# Patient Record
Sex: Male | Born: 1975 | Hispanic: No | Marital: Single
Health system: Southern US, Community
[De-identification: ages and names within clinical notes are randomized; demographics above are authoritative.]

## PROBLEM LIST (undated history)

## (undated) DIAGNOSIS — I10 Essential (primary) hypertension: Secondary | ICD-10-CM

## (undated) DIAGNOSIS — E079 Disorder of thyroid, unspecified: Secondary | ICD-10-CM

## (undated) HISTORY — PX: NECK SURGERY: SHX720

---

## 2018-06-04 ENCOUNTER — Encounter (HOSPITAL_BASED_OUTPATIENT_CLINIC_OR_DEPARTMENT_OTHER): Payer: Self-pay | Admitting: Emergency Medicine

## 2018-06-04 ENCOUNTER — Other Ambulatory Visit: Payer: Self-pay

## 2018-06-04 ENCOUNTER — Emergency Department (HOSPITAL_BASED_OUTPATIENT_CLINIC_OR_DEPARTMENT_OTHER): Payer: Self-pay

## 2018-06-04 ENCOUNTER — Emergency Department (HOSPITAL_BASED_OUTPATIENT_CLINIC_OR_DEPARTMENT_OTHER)
Admission: EM | Admit: 2018-06-04 | Discharge: 2018-06-04 | Disposition: A | Discharge status: Home / Self Care | Payer: Self-pay | Attending: Emergency Medicine | Admitting: Emergency Medicine

## 2018-06-04 DIAGNOSIS — Z79899 Other long term (current) drug therapy: Secondary | ICD-10-CM | POA: Insufficient documentation

## 2018-06-04 DIAGNOSIS — Y9302 Activity, running: Secondary | ICD-10-CM | POA: Insufficient documentation

## 2018-06-04 DIAGNOSIS — Y929 Unspecified place or not applicable: Secondary | ICD-10-CM | POA: Insufficient documentation

## 2018-06-04 DIAGNOSIS — Y998 Other external cause status: Secondary | ICD-10-CM | POA: Insufficient documentation

## 2018-06-04 DIAGNOSIS — Y33XXXA Other specified events, undetermined intent, initial encounter: Secondary | ICD-10-CM | POA: Insufficient documentation

## 2018-06-04 DIAGNOSIS — S93401A Sprain of unspecified ligament of right ankle, initial encounter: Secondary | ICD-10-CM | POA: Insufficient documentation

## 2018-06-04 DIAGNOSIS — I10 Essential (primary) hypertension: Secondary | ICD-10-CM | POA: Insufficient documentation

## 2018-06-04 HISTORY — DX: Essential (primary) hypertension: I10

## 2018-06-04 HISTORY — DX: Disorder of thyroid, unspecified: E07.9

## 2018-06-04 MED ORDER — HYDROCODONE-ACETAMINOPHEN 5-325 MG PO TABS
2.0000 | ORAL_TABLET | Freq: Once | ORAL | Status: AC
  Administered 2018-06-04: 2 via ORAL
  Filled 2018-06-04: qty 2

## 2018-06-04 MED ORDER — HYDROCODONE-ACETAMINOPHEN 5-325 MG PO TABS
1.0000 | ORAL_TABLET | Freq: Four times a day (QID) | ORAL | 0 refills | Status: AC | PRN
Start: 2018-06-04 — End: 2018-06-07

## 2018-06-04 MED ORDER — ALBUTEROL SULFATE (2.5 MG/3ML) 0.083% IN NEBU: 5.0000 mg | INHALATION_SOLUTION | Freq: Once | RESPIRATORY_TRACT | Status: DC

## 2018-06-04 MED ORDER — HYDROCODONE-ACETAMINOPHEN 10-325 MG PO TABS
1.0000 | ORAL_TABLET | Freq: Once | ORAL | Status: DC
  Filled 2018-06-04: qty 1

## 2018-06-04 NOTE — ED Triage Notes (Signed)
R foot pain and swelling since yesterday after twisting it. Swelling noted.

## 2018-06-04 NOTE — Discharge Instructions (Addendum)
You were seen today for right foot and ankle pain.  There is nothing broken on your x-rays which is good news.  You may have sprained or even torn a ligament in your foot though.  You will need to follow-up with orthopedic specialist for further management.  You can call [facility] as listed above.  Please take Tylenol or ibuprofen as needed for pain.  Continue icing the foot and ankle as you have been, you will need to do this at least 4 times a day for 10 minutes at a time.  Continue propping up your foot as you have been. Take care!

## 2018-06-04 NOTE — ED Notes (Signed)
Patient transported to X-ray 

## 2018-06-04 NOTE — ED Provider Notes (Signed)
[facility] EMERGENCY DEPARTMENT Provider Note   CSN: [id number] Arrival date & time: 06/04/18  1044     History   Chief Complaint Chief Complaint  Patient presents with  . Foot Injury    HPI James Hodges is a 42 y.o. male here for RIGHT foot injury  Patient notes that he was running yesterday in the grass, he was about 4 miles and when his ankle hyper inverted and foot supinated. Immediate symptoms: immediate pain, delayed swelling, was able to bear weight directly after injury, no deformity was noted by the patient. Symptoms have been worsening since that time. Prior history of related problems: previous surgery of the lateral ankle. There is pain and swelling at the lateral aspect of that ankle extends to the top part of the foot.  He has tried Tylenol and ibuprofen at home without relief of his pain.  He is unable to walk on his foot without severe pain.  He also endorses some numbness over the top part of his foot and difficulty flexing and extending his foot  HPI  Past Medical History:  Diagnosis Date  . Hypertension   . Thyroid disease     There are no active problems to display for this patient.   Past Surgical History:  Procedure Laterality Date  . NECK SURGERY          Home Medications    Prior to Admission medications   Medication Sig Start Date End Date Taking? Authorizing Provider  amLODipine (NORVASC) 10 MG tablet Take 10 mg by mouth daily.   Yes [provider]  losartan (COZAAR) 100 MG tablet Take 100 mg by mouth daily.   Yes [provider]    Family History No family history on file.  Social History Social History   Tobacco Use  . Smoking status: Not on file  Substance Use Topics  . Alcohol use: Not on file  . Drug use: Not on file     Allergies   Patient has no known allergies.   Review of Systems Review of Systems  Constitutional: Negative for activity change and fever.  HENT: Negative for  congestion and ear discharge.   Musculoskeletal: Positive for joint swelling.     Physical Exam Updated Vital Signs BP 134/78 (BP Location: Right Arm)   Pulse 89   Temp 97.9 F (36.6 C) (Oral)   Resp 16   Ht 5\' 10"  (1.778 m)   Wt 98.4 kg (217 lb)   SpO2 99%   BMI 31.14 kg/m   Physical Exam  Constitutional: He appears well-developed and well-nourished.  HENT:  Head: Normocephalic.  Eyes: Pupils are equal, round, and reactive to light.  Neck: Normal range of motion.  Cardiovascular: Normal rate, regular rhythm, normal heart sounds and intact distal pulses.  Pulmonary/Chest: Effort normal and breath sounds normal.  Abdominal: Soft.  Musculoskeletal:       Right ankle: He exhibits swelling (Swelling in the lateral ankle and dorsal part of the midfoot ) and ecchymosis (Lateral ankle and midfoot). He exhibits normal range of motion (Difficulty with flexion and extension secondary to pain), no deformity and normal pulse. Tenderness. Lateral malleolus and AITFL tenderness found. No medial malleolus, no head of 5th metatarsal and no proximal fibula tenderness found. Achilles tendon exhibits no pain.       Left ankle: Normal. He exhibits normal range of motion, no swelling, no ecchymosis, no deformity and normal pulse.  Unable to completely flex and extend right foot  secondary to pain. Unable to invert or evert foot     ED Treatments / Results  Labs (all labs ordered are listed, but only abnormal results are displayed) Labs Reviewed - No data to display  EKG None  Radiology Dg Ankle Complete Right  Result Date: 06/04/2018 CLINICAL DATA:  Right ankle twisting injury yesterday with onset of pain. Initial encounter. EXAM: RIGHT ANKLE - COMPLETE 3+ VIEW COMPARISON:  None. FINDINGS: No acute bony or joint abnormality is identified. Os trigonum is noted. Mild osteophytosis off the anterior lip of the tibia is seen. No tibiotalar joint effusion. IMPRESSION: No acute abnormality.  Electronically Signed   By: Drusilla Kanner M.D.   On: 06/04/2018 11:53   Dg Foot Complete Right  Result Date: 06/04/2018 CLINICAL DATA:  Right foot swelling and pain. EXAM: RIGHT FOOT COMPLETE - 3+ VIEW COMPARISON:  No recent. FINDINGS: No acute bony or joint abnormality identified. No evidence of fracture or dislocation. Diffuse mild degenerative change. IMPRESSION: No acute abnormality.  Diffuse mild degenerative change. Electronically Signed   By: Maisie Fus  Register   On: 06/04/2018 11:14    Procedures Procedures (including critical care time)  Medications Ordered in ED Medications  HYDROcodone-acetaminophen (NORCO/VICODIN) 5-325 MG per tablet 2 tablet (2 tablets Oral Given 06/04/18 1152)     Initial Impression / Assessment and Plan / ED Course  I have reviewed the triage vital signs and the nursing notes.  Pertinent labs & imaging results that were available during my care of the patient were reviewed by me and considered in my medical decision making (see chart for details).    42 year old male here for right ankle/foot injury in the setting of hyper inversion and supination while running.  Ankle and foot x-rays do not show any signs of acute fracture.  He likely has an ankle sprain.  Patient placed in cam boot and given 1 dose of Norco for pain.  He was also given crutches and instructed to follow-up with orthopedic specialist.  He will take Tylenol or ibuprofen as needed for pain at home.  Discussed "RICE" therapy. Patient stable for discharge at this time.   Final Clinical Impressions(s) / ED Diagnoses   Final diagnoses:  Sprain of right ankle, unspecified ligament, initial encounter    ED Discharge Orders    None       Beaulah Dinning, MD 06/04/18 1247    Pricilla Loveless, MD 06/04/18 [id number]

## 2020-05-19 IMAGING — CR DG FOOT COMPLETE 3+V*R*
3 series · 3 of 3 positions shown · non-contrast
Comparison: No recent.

CLINICAL DATA: Right foot swelling and pain.

EXAM:
RIGHT FOOT COMPLETE - 3+ VIEW

[t foot ap right]
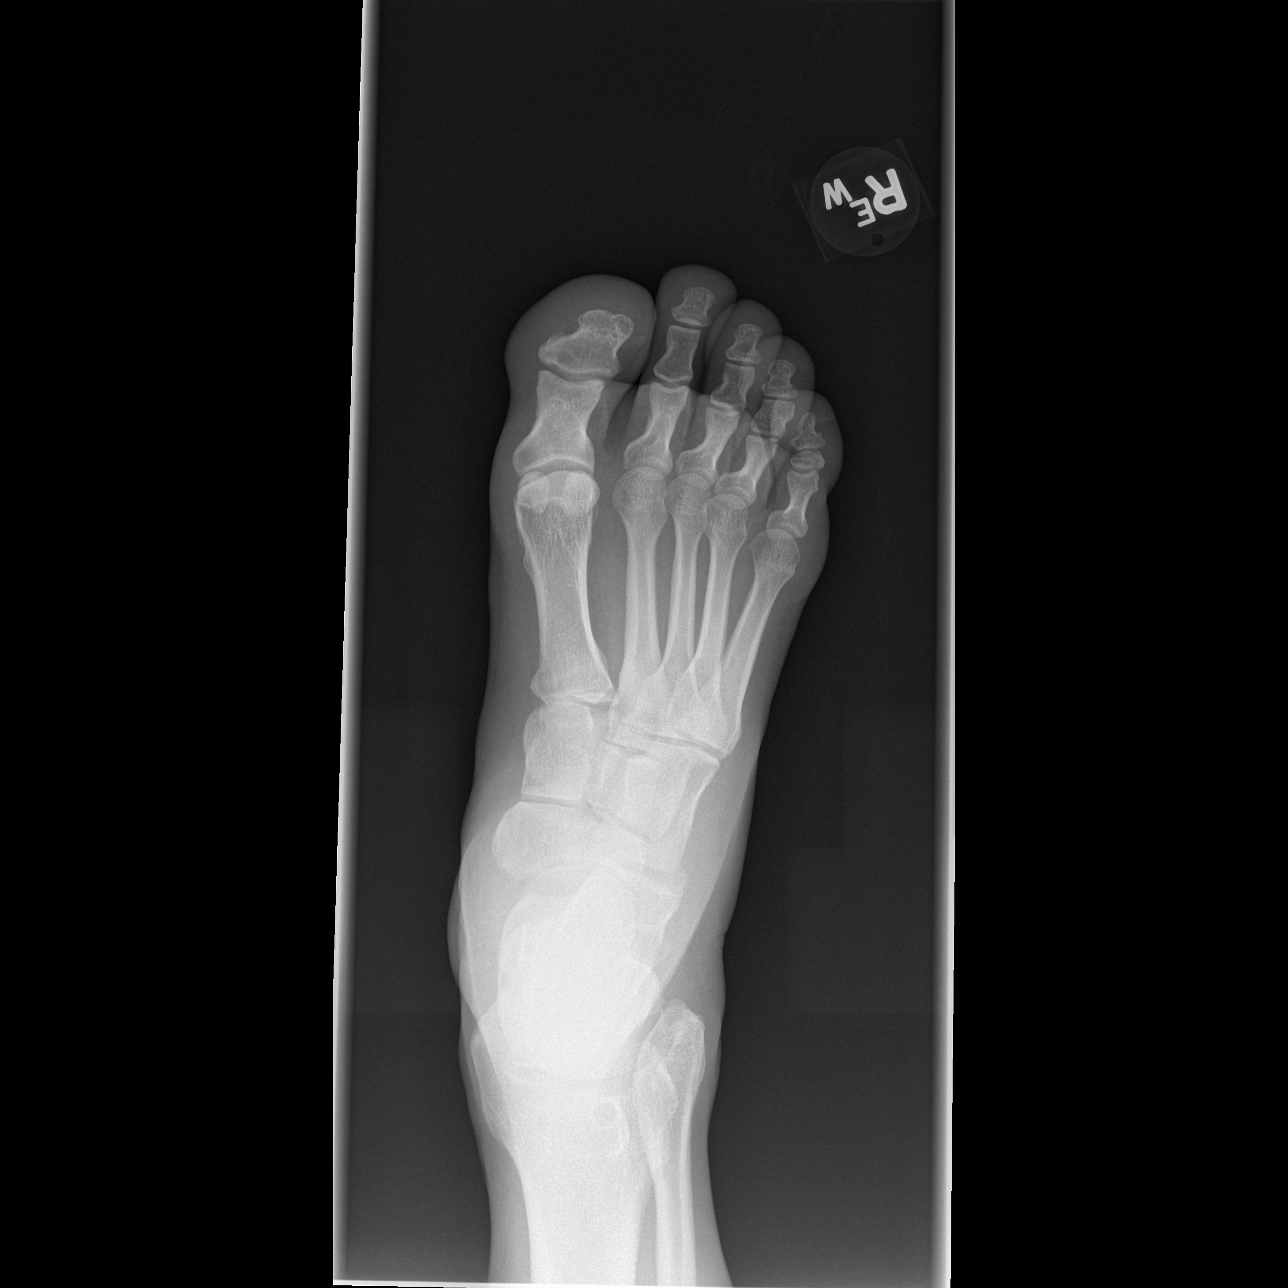

[t foot oblique right]
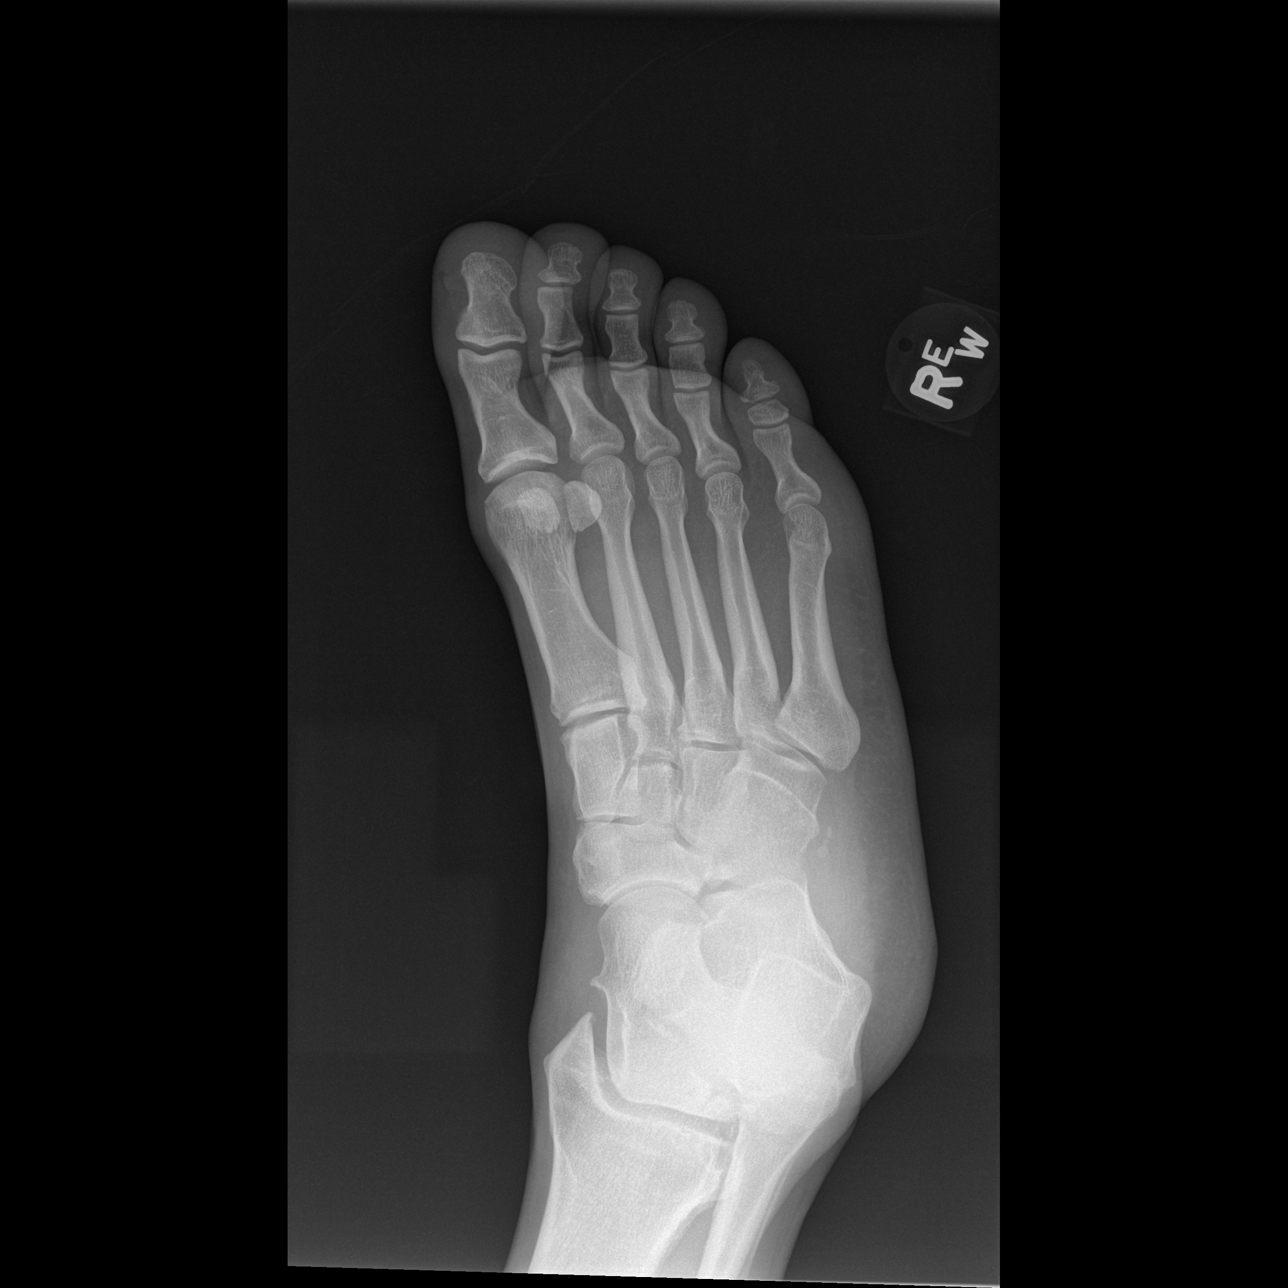

[t foot lat right]
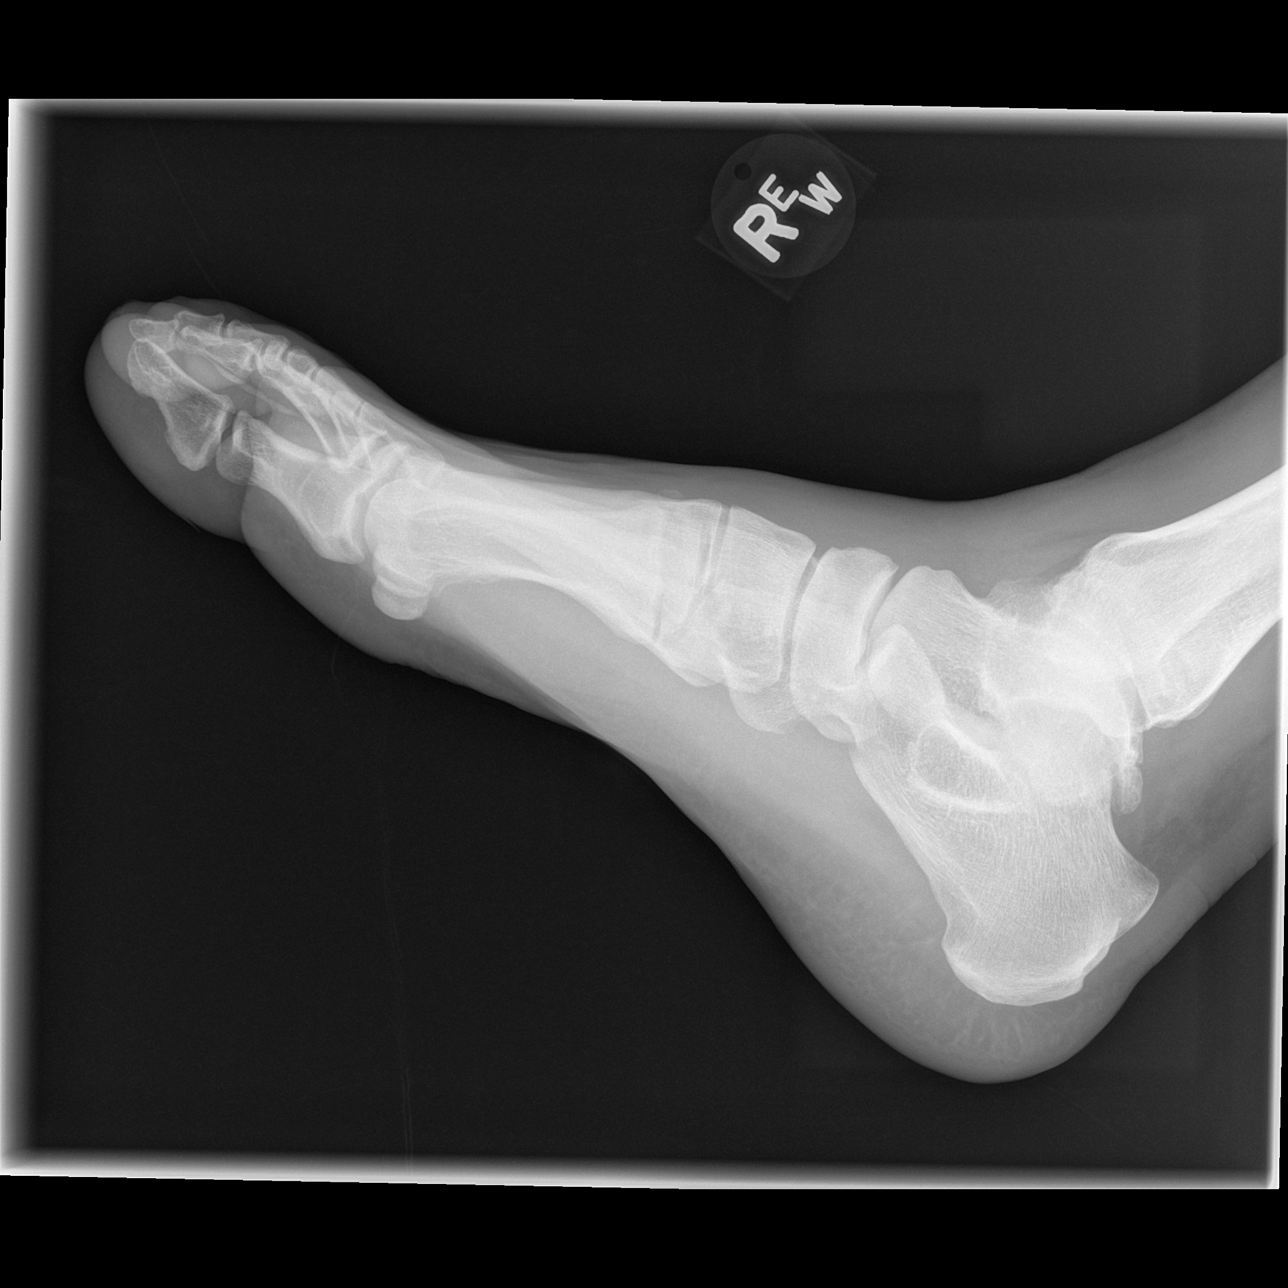

[3 of 3 positions shown; findings below may reference images not displayed]

FINDINGS: No acute bony or joint abnormality identified. No evidence of
fracture or dislocation. Diffuse mild degenerative change.
IMPRESSION: No acute abnormality.  Diffuse mild degenerative change.

## 2020-05-19 IMAGING — CR DG ANKLE COMPLETE 3+V*R*
3 series · 3 of 3 positions shown · non-contrast
Comparison: None.

CLINICAL DATA: Right ankle twisting injury yesterday with onset of
pain. Initial encounter.

EXAM:
RIGHT ANKLE - COMPLETE 3+ VIEW

[t ankle joint ap right]
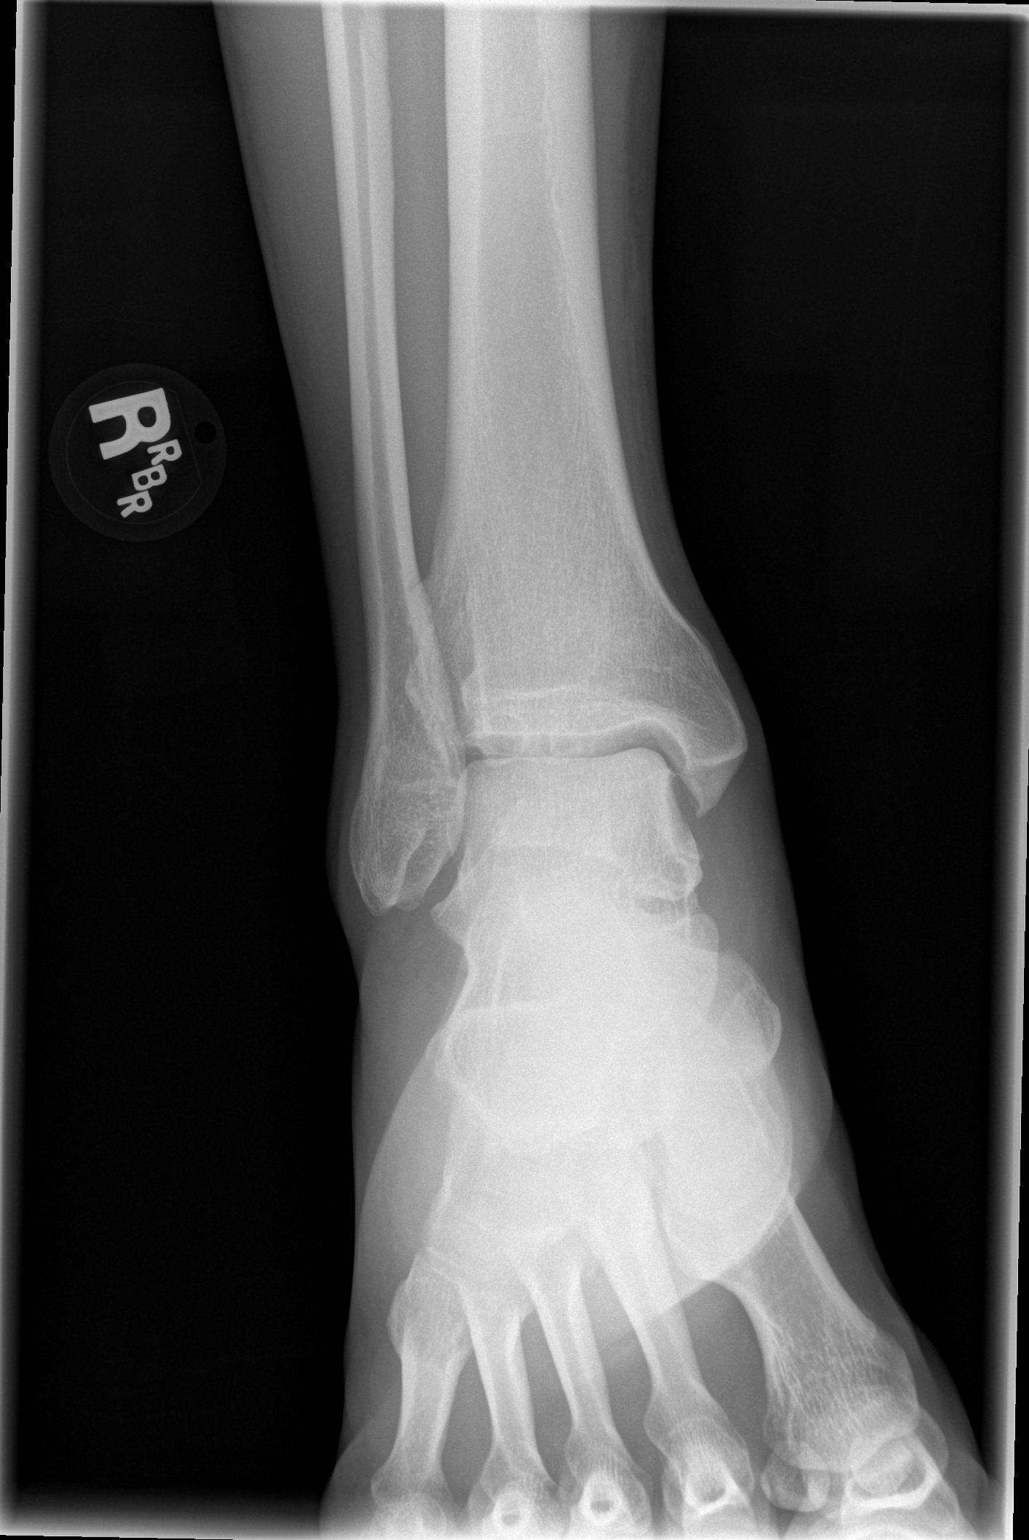

[t ankle joint oblique right]
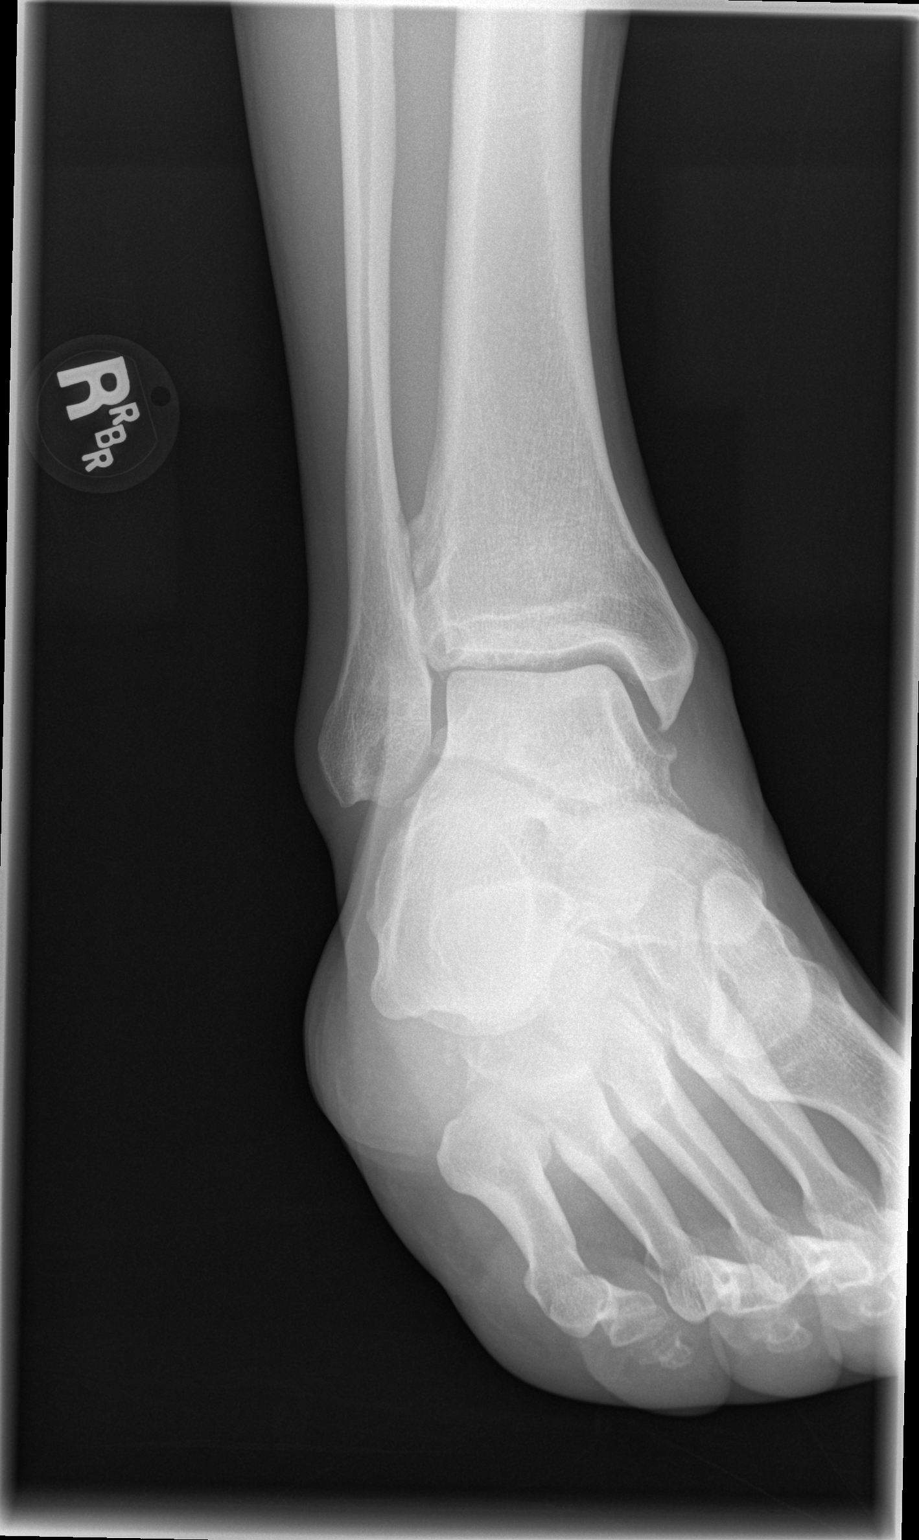

[t ankle joint lat right]
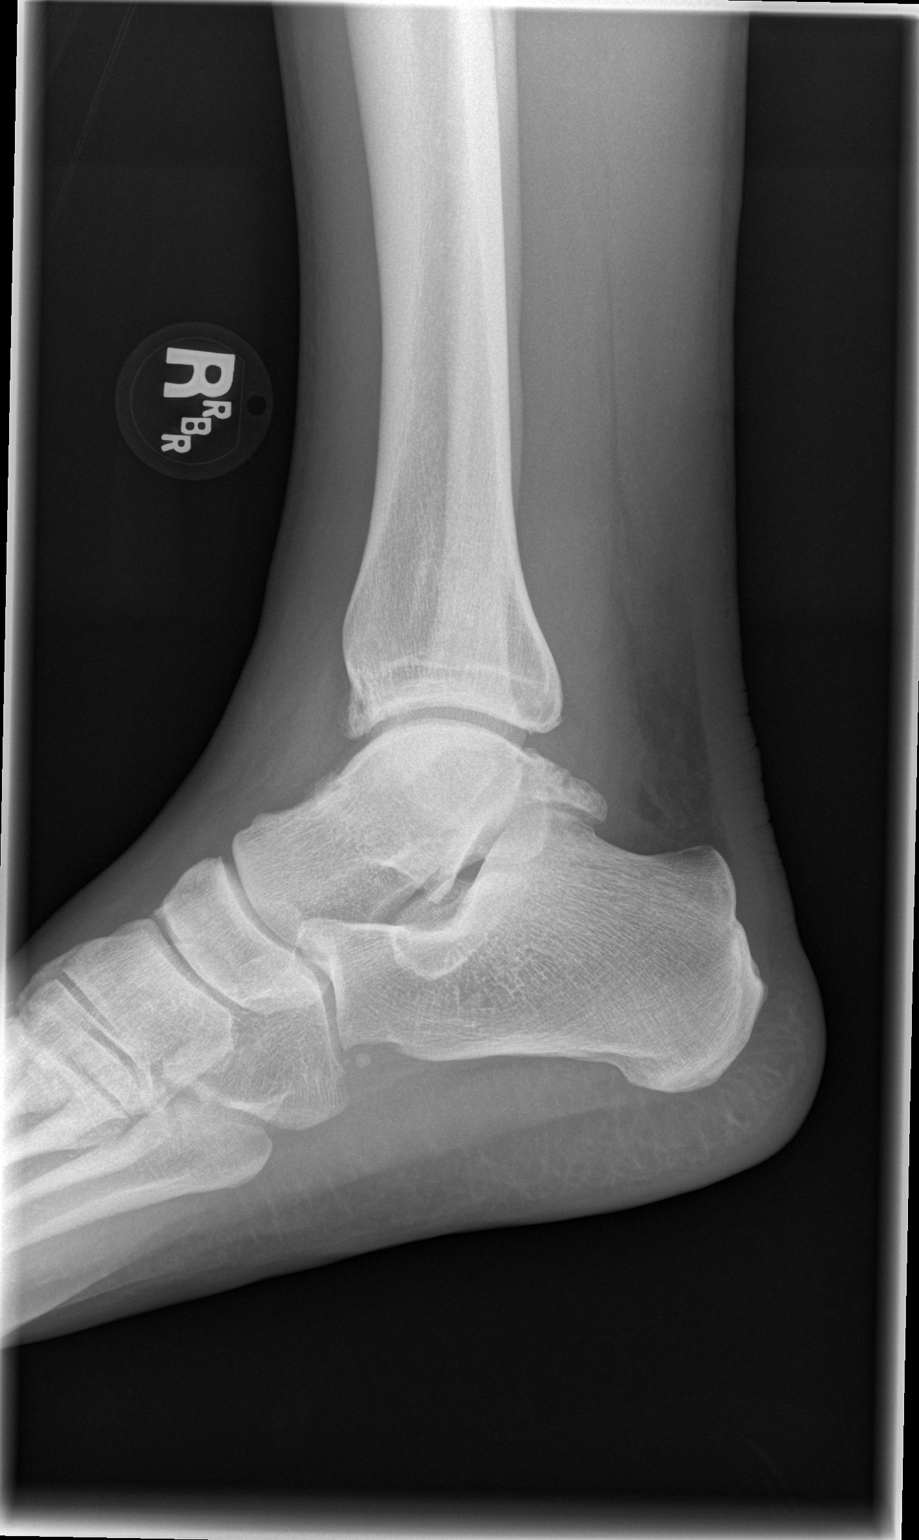

[3 of 3 positions shown; findings below may reference images not displayed]

FINDINGS: No acute bony or joint abnormality is identified. Os trigonum is
noted. Mild osteophytosis off the anterior lip of the tibia is seen.
No tibiotalar joint effusion.
IMPRESSION: No acute abnormality.

## 2023-07-26 ENCOUNTER — Other Ambulatory Visit: Payer: Self-pay

## 2023-07-26 ENCOUNTER — Emergency Department (HOSPITAL_BASED_OUTPATIENT_CLINIC_OR_DEPARTMENT_OTHER)
Admission: EM | Admit: 2023-07-26 | Discharge: 2023-07-26 | Discharge status: Against Medical Advice | Payer: Self-pay | Attending: Emergency Medicine | Admitting: Emergency Medicine

## 2023-07-26 DIAGNOSIS — R109 Unspecified abdominal pain: Secondary | ICD-10-CM | POA: Diagnosis present

## 2023-07-26 DIAGNOSIS — Z5321 Procedure and treatment not carried out due to patient leaving prior to being seen by health care provider: Secondary | ICD-10-CM | POA: Diagnosis not present

## 2023-07-26 NOTE — ED Triage Notes (Signed)
Patient presents to ED via POV from home. Here with abdominal pain. Expresses concern for IBS. Manic in triage, pressured speech noted.

## 2023-07-26 NOTE — ED Notes (Signed)
Urine cup provided. Patient notified of need of urine sample. Due to patient presentation (pressured speech and inability to sit still) we will defer labs until patient can be bedded due to safety concerns.
